# Patient Record
Sex: Female | Born: 1940
Health system: Southern US, Community
[De-identification: ages and names within clinical notes are randomized; demographics above are authoritative.]

---

## 2013-08-05 ENCOUNTER — Ambulatory Visit: Payer: Self-pay | Admitting: Internal Medicine

## 2013-10-07 ENCOUNTER — Ambulatory Visit: Payer: Self-pay | Admitting: Vascular Surgery

## 2013-10-07 LAB — CREATININE, SERUM: Creatinine: 0.87 mg/dL (ref 0.60–1.30)

## 2013-10-07 LAB — BUN: BUN: 11 mg/dL (ref 7–18)

## 2013-10-14 ENCOUNTER — Ambulatory Visit: Payer: Self-pay | Admitting: Vascular Surgery

## 2013-10-14 LAB — BUN: BUN: 15 mg/dL (ref 7–18)

## 2013-10-14 LAB — CREATININE, SERUM
Creatinine: 0.9 mg/dL (ref 0.60–1.30)
EGFR (African American): >60
EGFR (Non-African Amer.): >60

## 2015-01-24 NOTE — Op Note (Signed)
PATIENT NAME:  Susan Mcintyre, Susan Mcintyre MR#:  [id number] DATE OF BIRTH:  Oct 26, 1940  DATE OF PROCEDURE:  10/07/2013  PREOPERATIVE DIAGNOSES:  1.  Peripheral arterial disease with claudication, bilateral lower extremities.  2.  Status post previous intervention at an outside institution.  3.  Hypertension.   POSTOPERATIVE DIAGNOSES: 1.  Peripheral arterial disease with claudication, bilateral lower extremities.  2.  Status post previous intervention at an outside institution.  3.  Hypertension.   PROCEDURES:  1.  Ultrasound guidance for vascular access, right femoral artery.  2.  Catheter placement into left popliteal artery from right femoral approach.  3.  Aortogram and selective left lower extremity angiogram.  4.  Percutaneous transluminal angioplasty of the left superficial femoral artery for in-stent stenosis with a 5 mm diameter Lutonix drug-coated balloon.  5.  StarClose closure device, right femoral artery.   SURGEON: Festus Barren, M.D.   ANESTHESIA: Local with moderate conscious sedation.   ESTIMATED BLOOD LOSS: 25 mL.  FLUOROSCOPY TIME: Approximately 3 minutes.  \ CONTRAST USED: 40 mL.   INDICATION FOR PROCEDURE: This is a 74 year old African American female with vascular disease. She has had previous interventions at an outside institution. She presents with claudication of both lower extremities and a drop in her ABI. Noninvasive study showing stenosis in both superficial femoral arteries. She, at this point, desires intervention. The risks and benefits were discussed. Informed consent was obtained.   DESCRIPTION OF PROCEDURE: The patient was brought to the vascular interventional radiology suite. Groins were shaved and prepped and a sterile surgical field was created. Ultrasound was used to visualize a patent right femoral artery. This was accessed under direct ultrasound guidance without difficulty with a Seldinger needle. A J-wire was then placed. A 5-French sheath was placed  and an Amplatz Super Stiff wire had to be placed due to scar tissue in the groin to feed in the sheath. A pigtail catheter was then placed in the aorta at the L1 level and AP aortogram was performed. This showed no left renal artery was identifiable. A patent right renal artery. The aortoiliac segments were patent without flow-limiting stenosis. There was a steep aortic bifurcation. I crossed the aortic bifurcation with the pigtail catheter and the Terumo advantage wire and advanced the catheter down to the left femoral head. Selective left lower extremity angiogram was then performed. This demonstrated a moderate restenosis in the proximal portion of a previous SFA stent. This was in the 55% to 60% range. She then had 1 vessel runoff distally and no other obvious stenoses seen. The patient was systemically heparinized. A 6-French high flex Ansel sheath was placed over the Terumo advantage wire. The lesion was crossed without difficulty with a Kumpe catheter and the Terumo advantage wire and we confirmed intraluminal flow with a catheter in the popliteal artery. This also helped [place] opacify the tibial vessels. The wire was then replaced. A 5 mm diameter x 6 cm length Lutonix balloon was then inflated. A waist was taken, which resolved with 12 atmospheres of pressure. This was held for 1 minute. The balloon was then deflated and completion angiogram showed significant improvement in flow lumen with only about a 15% to 20% residual stenosis and a non-flow-limiting dissection. At this point, I elected to terminate the procedure. The sheath was then pulled back to the ipsilateral external iliac artery and oblique arteriogram was performed. StarClose closure device was deployed in the usual fashion with excellent hemostatic result. The patient tolerated the procedure well and was  taken to the recovery room in stable condition.    DATE OF PROCEDURE: Thank you for   ____________________________ Annice NeedyJason S. Dew,  MD jsd:aw D: 10/07/2013 10:08:20 ET T: 10/07/2013 10:19:04 ET JOB#: [id number]  cc: Annice NeedyJason S. Dew, MD, <Dictator> Annice NeedyJASON S DEW MD ELECTRONICALLY SIGNED 10/10/2013 13:36

## 2015-01-24 NOTE — Op Note (Signed)
PATIENT NAME:  Susan Mcintyre, Susan Mcintyre MR#:  [id number] DATE OF BIRTH:  Nov 13, 1940  DATE OF PROCEDURE:  10/14/2013  PREOPERATIVE DIAGNOSES:   1.  Peripheral arterial disease with claudication, bilateral lower extremities.  2.  Status post left lower extremity revascularization with improvement in symptoms.  3.  Hypertension.   POSTOPERATIVE DIAGNOSES: 1.  Peripheral arterial disease with claudication, bilateral lower extremities.  2.  Status post left lower extremity revascularization with improvement in symptoms.  3.  Hypertension.   PROCEDURE:   1.  Ultrasound guidance for vascular access to left femoral artery.  2.  Catheter placement into right popliteal artery from left femoral approach.  3.  Percutaneous transluminal angioplasty of right superficial femoral artery and the proximal popliteal artery with 5 mm diameter angioplasty balloon.  4.  Viabahn stent placement for residual stenosis and dissection in the right superficial femoral artery and the proximal popliteal arteries with 6 mm diameter Viabahn stent.  5.  StarClose closure device left femoral artery.   SURGEON: Annice NeedyJason S. Dew, M.D.   ANESTHESIA: Local with moderate conscious sedation.   ESTIMATED BLOOD LOSS: Minimal.   CONTRAST USED: 45 mL Visipaque.   FLUOROSCOPY TIME: Less than five minutes.   INDICATION FOR PROCEDURE: This is a 74 year old African American female with peripheral vascular disease. She has [place] previous [place]. She presented to us several weeks ago with recurrent claudication symptoms and noninvasive study showing stenosis in both superficial femoral arteries near the areas of previous intervention. She is brought in for treatment of the left lower extremity first and she has done well from this with improvement in symptoms. She now desires intervention for her right lower extremity. The risks and benefits were discussed. Informed consent was obtained.   DESCRIPTION OF  PROCEDURE: The patient is brought to the vascular interventional radiology suite. Groins were sterilely prepped and draped and a sterile surgical field was created. Ultrasound was used to visualize a patent left femoral artery that was accessed under direct ultrasound guidance without difficulty with Seldinger needle. A J-wire and 5 French sheath were then placed. Pigtail catheter was not used, as we had already performed an aortogram and seen no aortoiliac stenosis. A rim catheter was used to cross the aortic bifurcation without difficulty and selective right lower extremity angiogram was then performed. This demonstrated moderate stenosis just proximal to the previous SFA stent in the 60% to 65% range. The distal superficial femoral artery and above-knee popliteal artery just below the previously placed stent had a slightly higher degree of stenosis in the 75% to 80% range. She then had two-vessel runoff distally. The patient was heparinized. A 6 French Ansell sheath was placed over the Terumo  advantage wire across the lesion without difficulty with the Terumo advantage wire and Kumpe catheter confirming intraluminal flow in the popliteal artery distally. I then replaced the wire. A 5 mm diameter angioplasty balloon was inflated to encompass the lesions. Weights were taken which resolved with angioplasty. There was dissection both above and below the previously placed stent in the areas of treatment, and I elected to exchange for an 0.018 wire and used a Viabahn stent for recurrent stenosis in this area. A 6 mm diameter x 15 cm length stent was deployed and post dilated with a 5 mm balloon with an excellent angiographic completion result and maintained flow distally. At this point, I elected to terminate the procedure. The sheath was pulled back to the ipsilateral external iliac artery and oblique arteriogram was  performed. StarClose was deployed in the usual fashion with excellent hemostatic result. The patient  tolerated the procedure well and was taken to the recovery room in stable condition.     ____________________________ Annice Needy, MD jsd:cc D: 10/14/2013 12:03:52 ET T: 10/14/2013 19:45:26 ET JOB#: [id number]  cc: Annice Needy, MD, <Dictator> Annice Needy MD ELECTRONICALLY SIGNED 10/21/2013 11:44

## 2015-05-23 IMAGING — XA IR VASCULAR PROCEDURE
11 series · 15 of 21 positions shown · IV contrast (IODINE)
Comparison: none

[Series 1: care aorta · 1 of 2 slices shown]
[im 1/2]
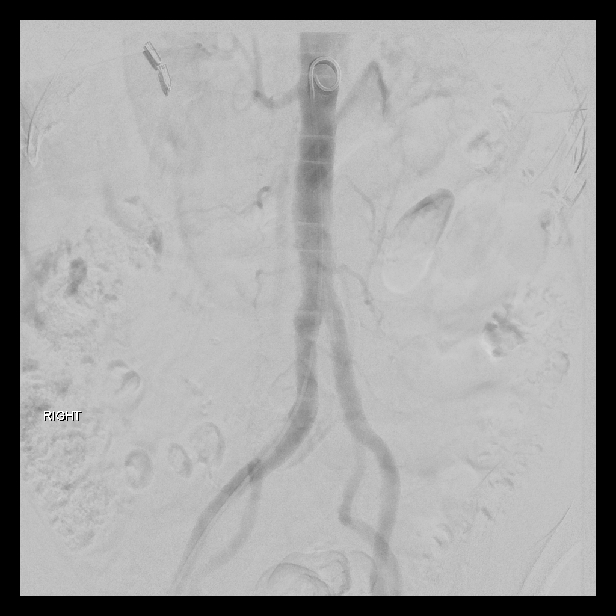

[Series 2: care sfa · 2 of 2 slices shown (1 of 9)]
[im 1/2]
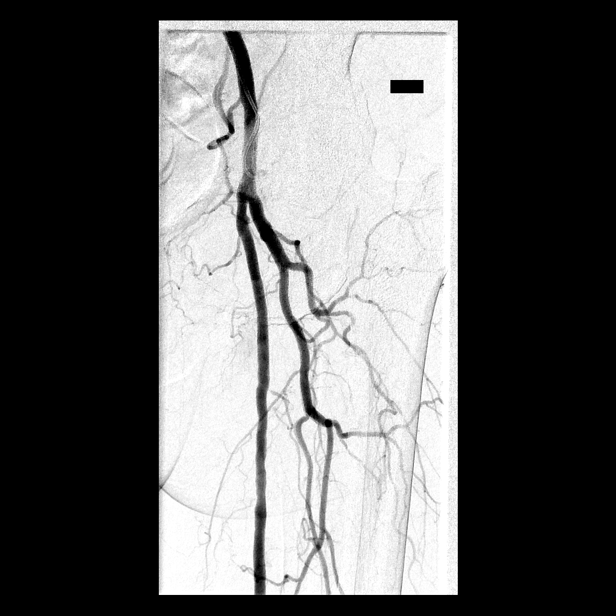
[im 2/2]
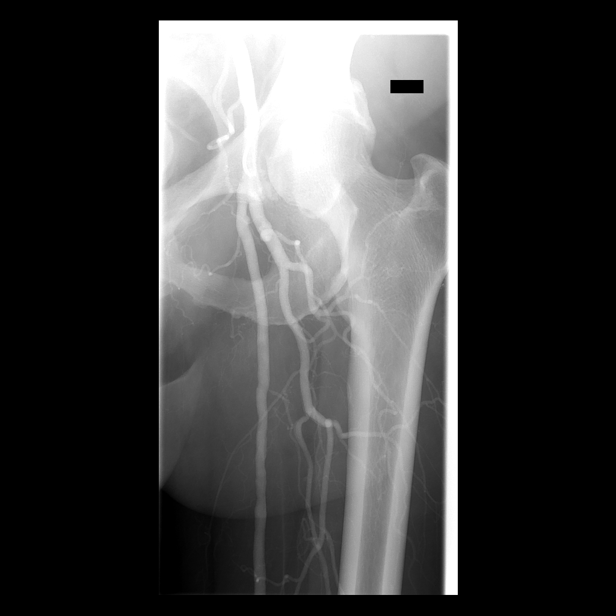

[Series 3: care sfa · 1 of 2 slices shown (2 of 9)]
[im 1/2]
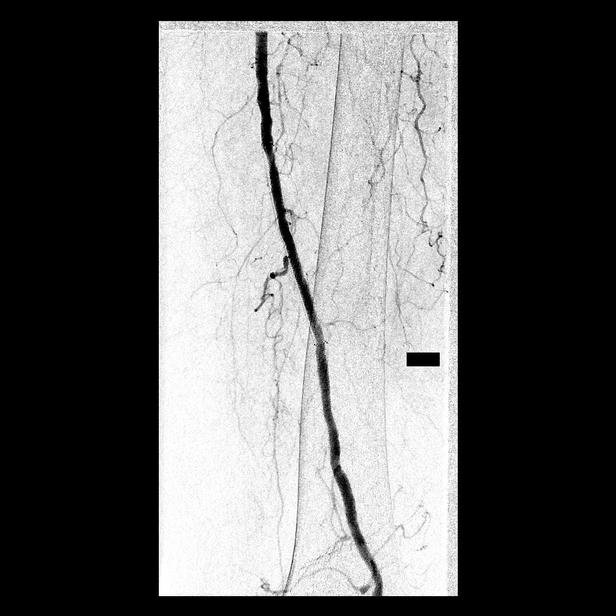

[Series 4: care sfa · 2 of 2 slices shown (3 of 9)]
[im 1/2]
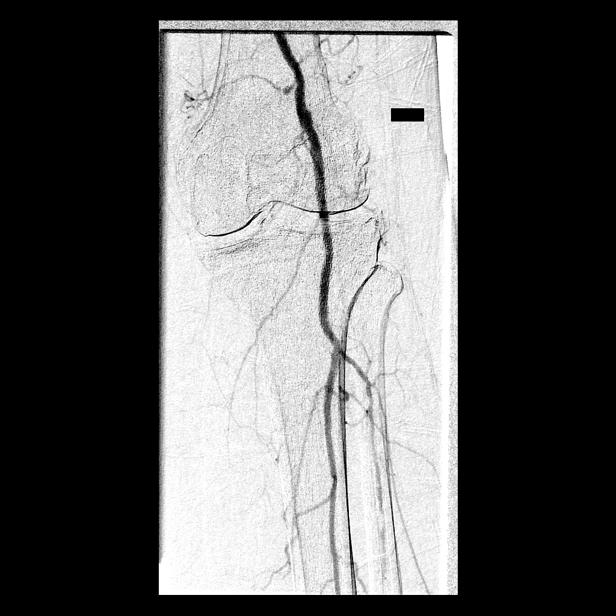
[im 2/2]
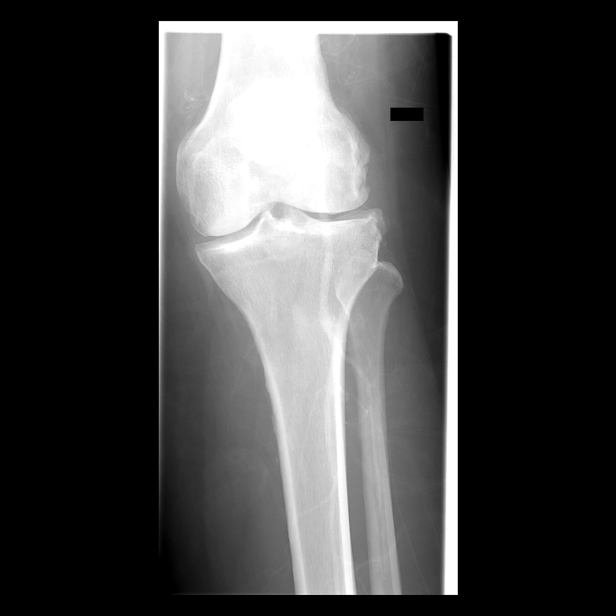

[Series 5: care sfa · 1 of 2 slices shown (4 of 9)]
[im 2/2]
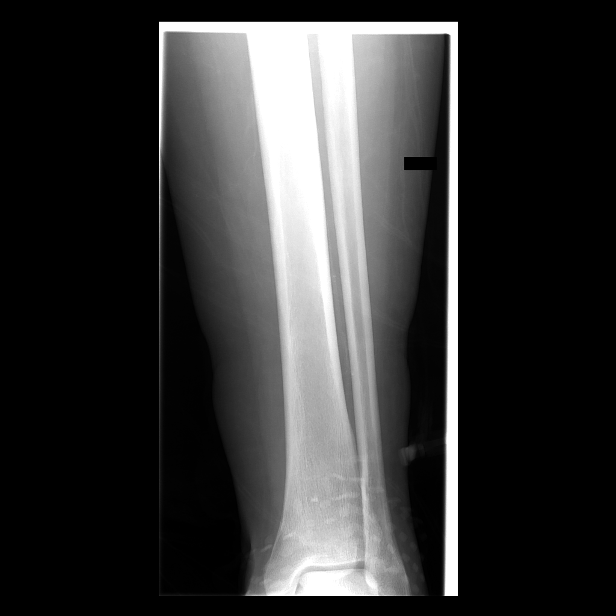

[Series 6: care sfa · 2 of 2 slices shown (5 of 9)]
[im 1/2]
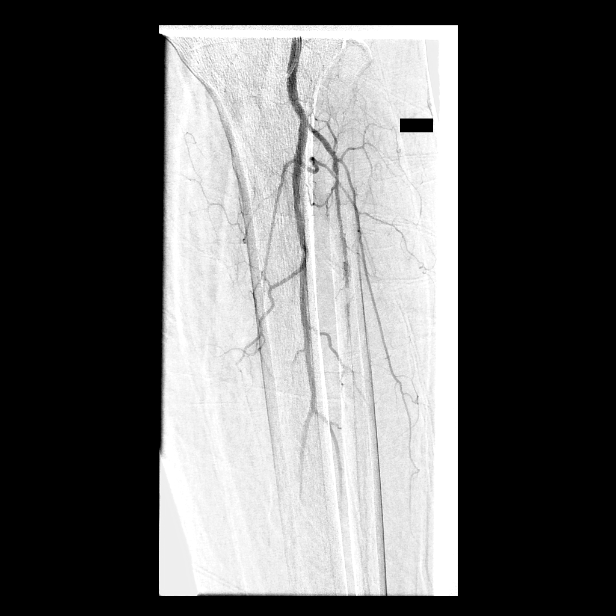
[im 2/2]
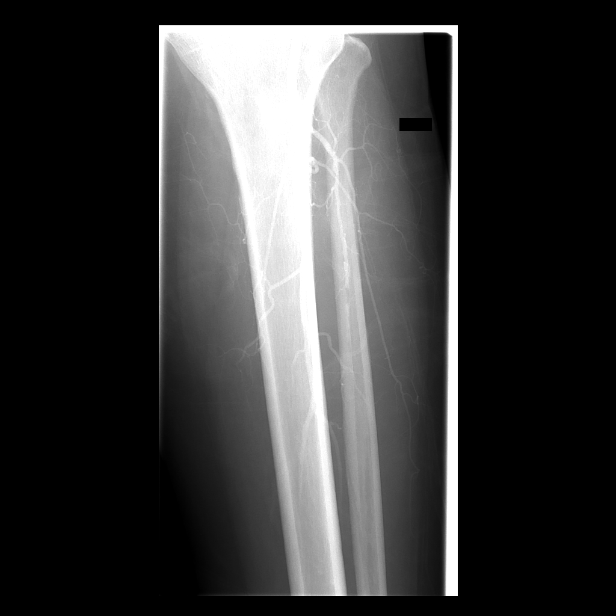

[Series 7: care sfa · 1 of 2 slices shown (6 of 9)]
[im 2/2]
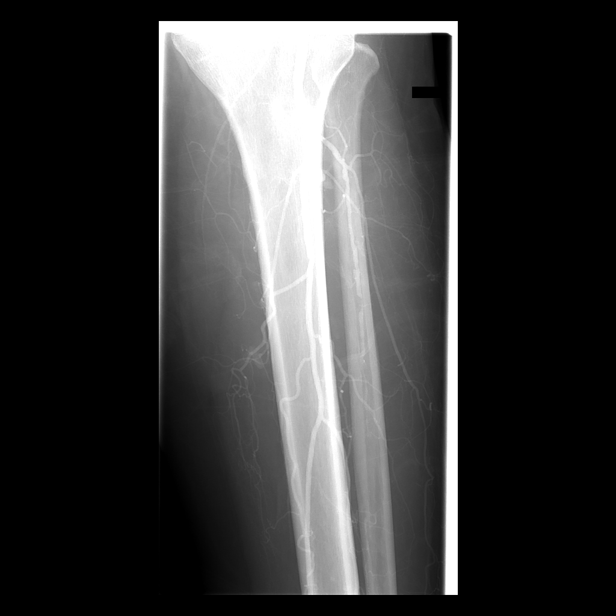

[Series 8: care sfa · 1 of 2 slices shown (7 of 9)]
[im 1/2]
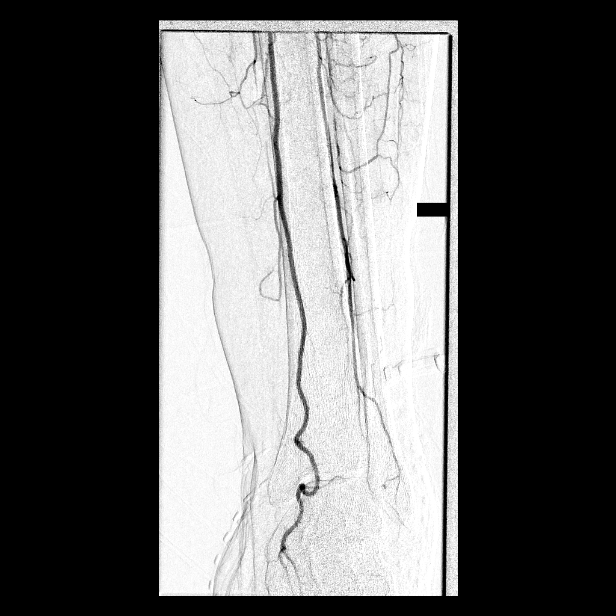

[Series 9: fl - angio · 1 of 1 slices shown]
[im 1/1]
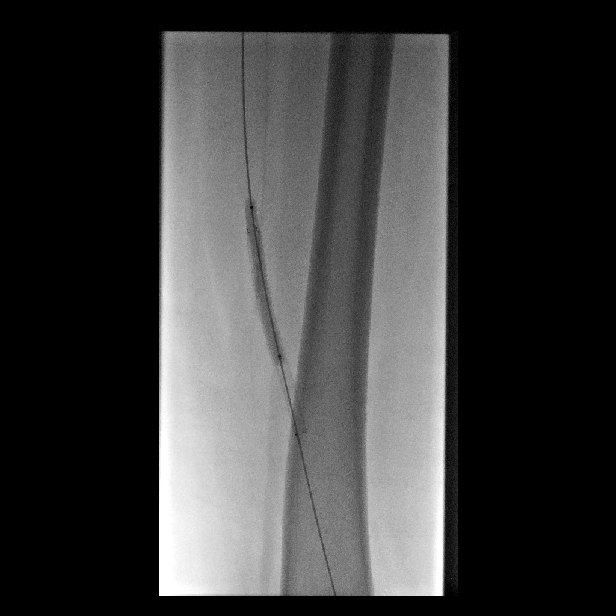

[Series 10: care sfa · 2 of 2 slices shown (8 of 9)]
[im 1/2]
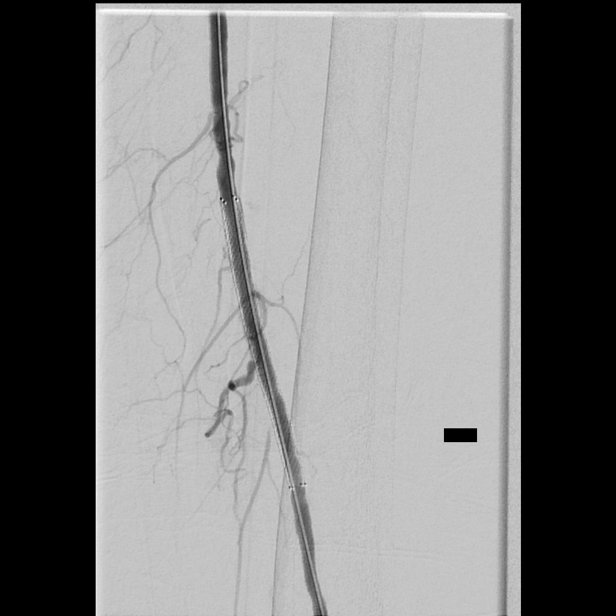
[im 2/2]
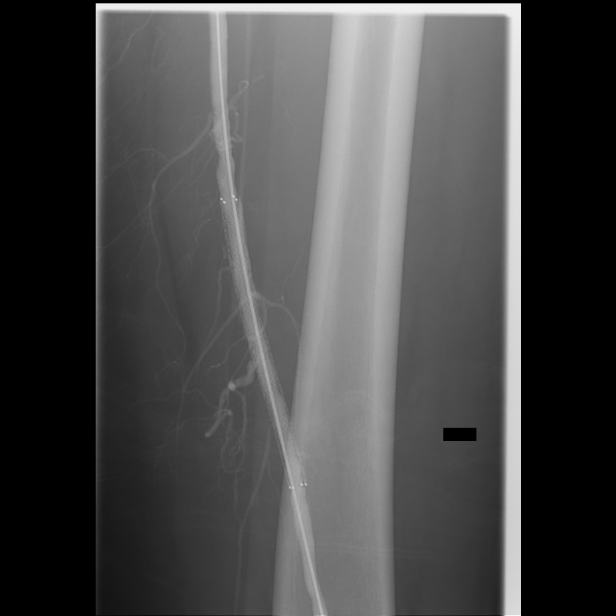

[Series 11: care sfa · 1 of 2 slices shown (9 of 9)]
[im 2/2]
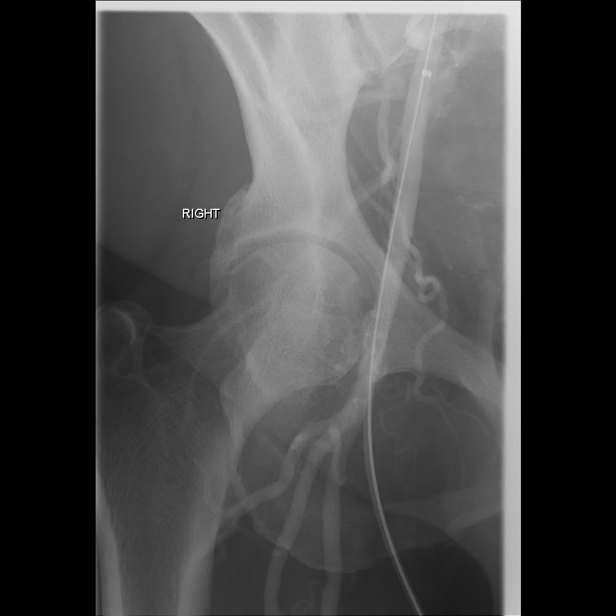

[15 of 21 positions shown; findings below may reference images not displayed]

IMAGES IMPORTED FROM THE [employer] WORKFLOW SYSTEM
NO DICTATION FOR STUDY

## 2017-05-22 ENCOUNTER — Ambulatory Visit (INDEPENDENT_AMBULATORY_CARE_PROVIDER_SITE_OTHER): Payer: Medicare Other | Admitting: Otolaryngology

## 2017-05-22 DIAGNOSIS — H6123 Impacted cerumen, bilateral: Secondary | ICD-10-CM | POA: Diagnosis not present

## 2017-05-22 DIAGNOSIS — H903 Sensorineural hearing loss, bilateral: Secondary | ICD-10-CM | POA: Diagnosis not present

## 2018-05-21 ENCOUNTER — Ambulatory Visit (INDEPENDENT_AMBULATORY_CARE_PROVIDER_SITE_OTHER): Payer: Medicare Other | Admitting: Otolaryngology

## 2023-09-25 ENCOUNTER — Other Ambulatory Visit (HOSPITAL_COMMUNITY): Payer: Self-pay | Admitting: Internal Medicine

## 2023-09-25 DIAGNOSIS — Z13820 Encounter for screening for osteoporosis: Secondary | ICD-10-CM

## 2023-10-18 ENCOUNTER — Other Ambulatory Visit (HOSPITAL_COMMUNITY): Payer: Medicare Other
